# Patient Record
Sex: Male | Born: 1985 | Race: White | Hispanic: No | Marital: Married | Smoking: Never smoker
Health system: Southern US, Community
[De-identification: ages and names within clinical notes are randomized; demographics above are authoritative.]

## PROBLEM LIST (undated history)

## (undated) DIAGNOSIS — I10 Essential (primary) hypertension: Secondary | ICD-10-CM

---
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
## 2004-01-14 ENCOUNTER — Ambulatory Visit (HOSPITAL_COMMUNITY): Admission: RE | Admit: 2004-01-14 | Discharge: 2004-01-14 | Payer: Self-pay | Admitting: Family Medicine

## 2004-11-12 ENCOUNTER — Encounter: Admission: RE | Admit: 2004-11-12 | Discharge: 2004-11-12 | Payer: Self-pay | Admitting: Emergency Medicine

## 2020-03-13 ENCOUNTER — Other Ambulatory Visit: Payer: Self-pay

## 2020-03-13 ENCOUNTER — Emergency Department
Admission: EM | Admit: 2020-03-13 | Discharge: 2020-03-13 | Disposition: A | Discharge status: Home / Self Care | Payer: No Typology Code available for payment source | Attending: Emergency Medicine | Admitting: Emergency Medicine

## 2020-03-13 DIAGNOSIS — M542 Cervicalgia: Secondary | ICD-10-CM | POA: Diagnosis not present

## 2020-03-13 DIAGNOSIS — M7918 Myalgia, other site: Secondary | ICD-10-CM

## 2020-03-13 DIAGNOSIS — M546 Pain in thoracic spine: Secondary | ICD-10-CM | POA: Insufficient documentation

## 2020-03-13 DIAGNOSIS — S8992XA Unspecified injury of left lower leg, initial encounter: Secondary | ICD-10-CM | POA: Diagnosis present

## 2020-03-13 DIAGNOSIS — S8002XA Contusion of left knee, initial encounter: Secondary | ICD-10-CM | POA: Diagnosis not present

## 2020-03-13 MED ORDER — NAPROXEN 500 MG PO TABS: 500.0000 mg | ORAL_TABLET | Freq: Two times a day (BID) | ORAL | 0 refills | Status: AC

## 2020-03-13 MED ORDER — CYCLOBENZAPRINE HCL 10 MG PO TABS: 10.0000 mg | ORAL_TABLET | Freq: Three times a day (TID) | ORAL | 0 refills | Status: AC | PRN

## 2020-03-13 MED ORDER — NAPROXEN 500 MG PO TABS
500.0000 mg | ORAL_TABLET | Freq: Once | ORAL | Status: AC
  Administered 2020-03-13: 500 mg via ORAL
  Filled 2020-03-13: qty 1

## 2020-03-13 MED ORDER — CYCLOBENZAPRINE HCL 10 MG PO TABS
10.0000 mg | ORAL_TABLET | Freq: Once | ORAL | Status: DC
  Filled 2020-03-13: qty 1

## 2020-03-13 MED ORDER — HYDROCODONE-ACETAMINOPHEN 5-325 MG PO TABS: 1.0000 | ORAL_TABLET | Freq: Three times a day (TID) | ORAL | 0 refills | Status: AC | PRN

## 2020-03-13 NOTE — Discharge Instructions (Signed)
Your exam is normal at this time.  No signs of any acute fracture or dislocation based on your symptoms and presentation.  You would expect to experience several days of muscle soreness and stiffness.  Take the prescription medications as provided.  Follow-up with your primary provider for ongoing symptoms.
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
## 2020-03-15 ENCOUNTER — Emergency Department: Payer: No Typology Code available for payment source

## 2020-03-15 ENCOUNTER — Emergency Department
Admission: EM | Admit: 2020-03-15 | Discharge: 2020-03-15 | Disposition: A | Discharge status: Home / Self Care | Payer: No Typology Code available for payment source | Attending: Emergency Medicine | Admitting: Emergency Medicine

## 2020-03-15 ENCOUNTER — Encounter: Payer: Self-pay | Admitting: Emergency Medicine

## 2020-03-15 ENCOUNTER — Other Ambulatory Visit: Payer: Self-pay

## 2020-03-15 DIAGNOSIS — I10 Essential (primary) hypertension: Secondary | ICD-10-CM | POA: Insufficient documentation

## 2020-03-15 DIAGNOSIS — R197 Diarrhea, unspecified: Secondary | ICD-10-CM | POA: Insufficient documentation

## 2020-03-15 DIAGNOSIS — E860 Dehydration: Secondary | ICD-10-CM | POA: Diagnosis not present

## 2020-03-15 DIAGNOSIS — H02843 Edema of right eye, unspecified eyelid: Secondary | ICD-10-CM | POA: Insufficient documentation

## 2020-03-15 DIAGNOSIS — D696 Thrombocytopenia, unspecified: Secondary | ICD-10-CM | POA: Diagnosis not present

## 2020-03-15 DIAGNOSIS — R112 Nausea with vomiting, unspecified: Secondary | ICD-10-CM | POA: Diagnosis not present

## 2020-03-15 DIAGNOSIS — R1084 Generalized abdominal pain: Secondary | ICD-10-CM

## 2020-03-15 DIAGNOSIS — R1013 Epigastric pain: Secondary | ICD-10-CM | POA: Diagnosis present

## 2020-03-15 HISTORY — DX: Essential (primary) hypertension: I10

## 2020-03-15 LAB — URINALYSIS, COMPLETE (UACMP) WITH MICROSCOPIC
Bacteria, UA: NONE SEEN
Bilirubin Urine: NEGATIVE
Glucose, UA: NEGATIVE mg/dL
Hgb urine dipstick: NEGATIVE
Ketones, ur: NEGATIVE mg/dL
Leukocytes,Ua: NEGATIVE
Nitrite: NEGATIVE
Protein, ur: 100 mg/dL — AB
Specific Gravity, Urine: 1.032 — ABNORMAL HIGH (ref 1.005–1.030)
Squamous Epithelial / LPF: NONE SEEN (ref 0–5)
pH: 5 (ref 5.0–8.0)

## 2020-03-15 LAB — CBC WITH DIFFERENTIAL/PLATELET
Abs Immature Granulocytes: 0.03 10*3/uL (ref 0.00–0.07)
Basophils Absolute: 0 10*3/uL (ref 0.0–0.1)
Basophils Relative: 0 %
Eosinophils Absolute: 0.2 10*3/uL (ref 0.0–0.5)
Eosinophils Relative: 3 %
HCT: 44.2 % (ref 39.0–52.0)
Hemoglobin: 14.6 g/dL (ref 13.0–17.0)
Immature Granulocytes: 0 %
Lymphocytes Relative: 12 %
Lymphs Abs: 0.9 10*3/uL (ref 0.7–4.0)
MCH: 28.1 pg (ref 26.0–34.0)
MCHC: 33 g/dL (ref 30.0–36.0)
MCV: 85 fL (ref 80.0–100.0)
Monocytes Absolute: 0.6 10*3/uL (ref 0.1–1.0)
Monocytes Relative: 9 %
Neutro Abs: 5.6 10*3/uL (ref 1.7–7.7)
Neutrophils Relative %: 76 %
Platelets: 83 10*3/uL — ABNORMAL LOW (ref 150–400)
RBC: 5.2 MIL/uL (ref 4.22–5.81)
RDW: 14.4 % (ref 11.5–15.5)
Smear Review: DECREASED
WBC: 7.4 10*3/uL (ref 4.0–10.5)
nRBC: 0 % (ref 0.0–0.2)

## 2020-03-15 LAB — LIPASE, BLOOD: Lipase: 24 U/L (ref 11–51)

## 2020-03-15 LAB — COMPREHENSIVE METABOLIC PANEL
ALT: 41 U/L (ref 0–44)
AST: 26 U/L (ref 15–41)
Albumin: 4 g/dL (ref 3.5–5.0)
Alkaline Phosphatase: 50 U/L (ref 38–126)
Anion gap: 10 (ref 5–15)
BUN: 17 mg/dL (ref 6–20)
CO2: 24 mmol/L (ref 22–32)
Calcium: 8.3 mg/dL — ABNORMAL LOW (ref 8.9–10.3)
Chloride: 104 mmol/L (ref 98–111)
Creatinine, Ser: 0.95 mg/dL (ref 0.61–1.24)
GFR, Estimated: >60 mL/min (ref >60)
Glucose, Bld: 121 mg/dL — ABNORMAL HIGH (ref 70–99)
Potassium: 3.9 mmol/L (ref 3.5–5.1)
Sodium: 138 mmol/L (ref 135–145)
Total Bilirubin: 1.2 mg/dL (ref 0.3–1.2)
Total Protein: 7.2 g/dL (ref 6.5–8.1)

## 2020-03-15 LAB — LACTIC ACID, PLASMA: Lactic Acid, Venous: 1.3 mmol/L (ref 0.5–1.9)

## 2020-03-15 MED ORDER — MORPHINE SULFATE (PF) 4 MG/ML IV SOLN
4.0000 mg | Freq: Once | INTRAVENOUS | Status: DC
  Filled 2020-03-15: qty 1

## 2020-03-15 MED ORDER — ONDANSETRON HCL 4 MG/2ML IJ SOLN
4.0000 mg | Freq: Once | INTRAMUSCULAR | Status: AC
  Administered 2020-03-15: 4 mg via INTRAVENOUS
  Filled 2020-03-15: qty 2

## 2020-03-15 MED ORDER — IOHEXOL 300 MG/ML  SOLN
125.0000 mL | Freq: Once | INTRAMUSCULAR | Status: AC | PRN
  Administered 2020-03-15: 125 mL via INTRAVENOUS

## 2020-03-15 MED ORDER — ONDANSETRON 4 MG PO TBDP: 4.0000 mg | ORAL_TABLET | Freq: Three times a day (TID) | ORAL | 0 refills | Status: DC | PRN

## 2020-03-15 MED ORDER — LACTATED RINGERS IV BOLUS
1000.0000 mL | Freq: Once | INTRAVENOUS | Status: AC
  Administered 2020-03-15: 1000 mL via INTRAVENOUS
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
## 2020-03-15 NOTE — Discharge Instructions (Addendum)
As we discussed, you had signs of dehydration, but largely reassuring work-up.  I sent a prescription for Zofran nausea medicine to use as needed to the [facility].  If you have any worsening symptoms despite this medication, particularly fevers, passing out or uncontrolled symptoms, please return to the ED.
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
## 2021-09-07 IMAGING — CR DG CHEST 2V
1 series · 2 of 2 positions shown · non-contrast
Comparison: None.

CLINICAL DATA: Vomiting and diarrhea.

EXAM:
CHEST - 2 VIEW

[Series 1: dg chest 2 view · 0.14mm/px · 2 of 2 slices shown]
[im 1/2]
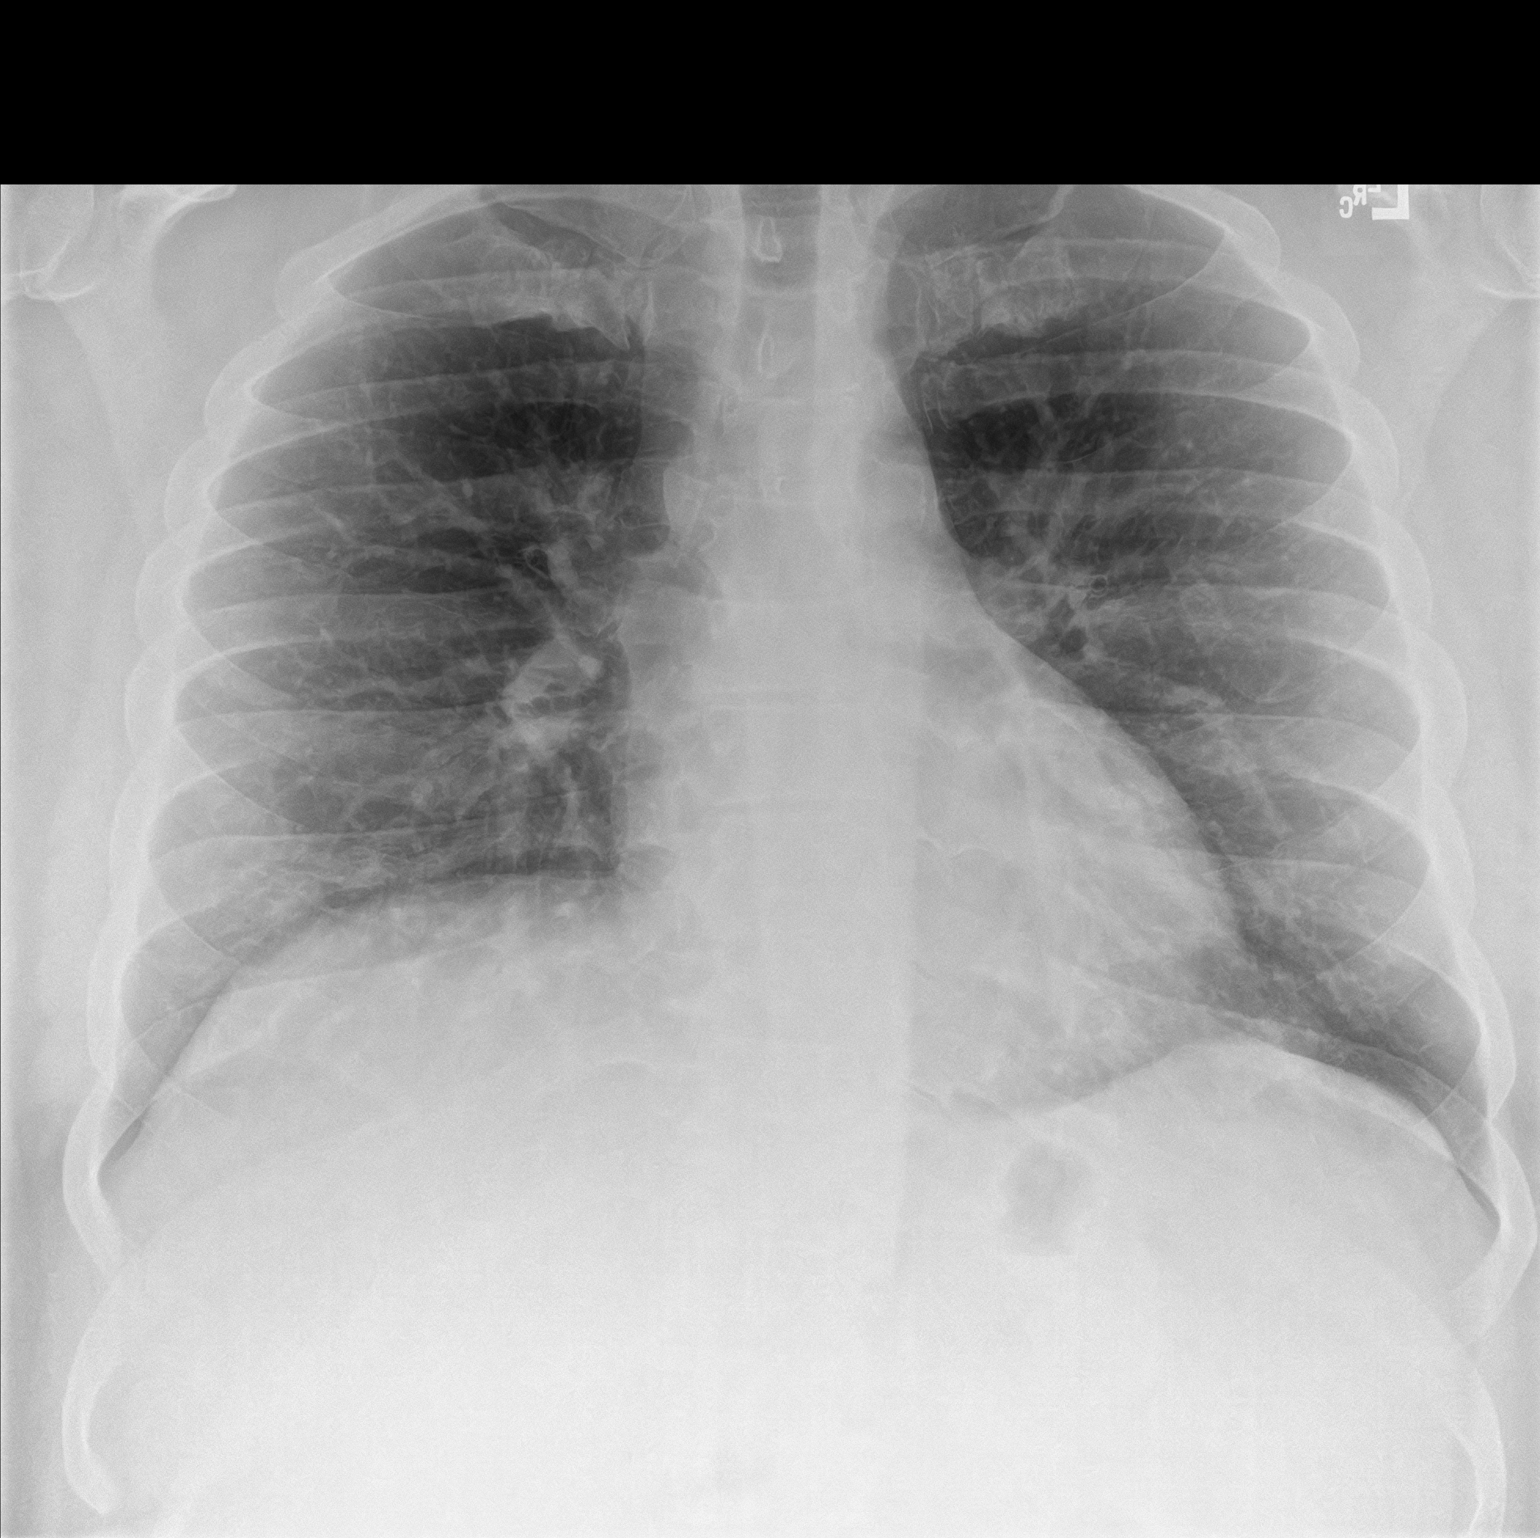
[im 2/2]
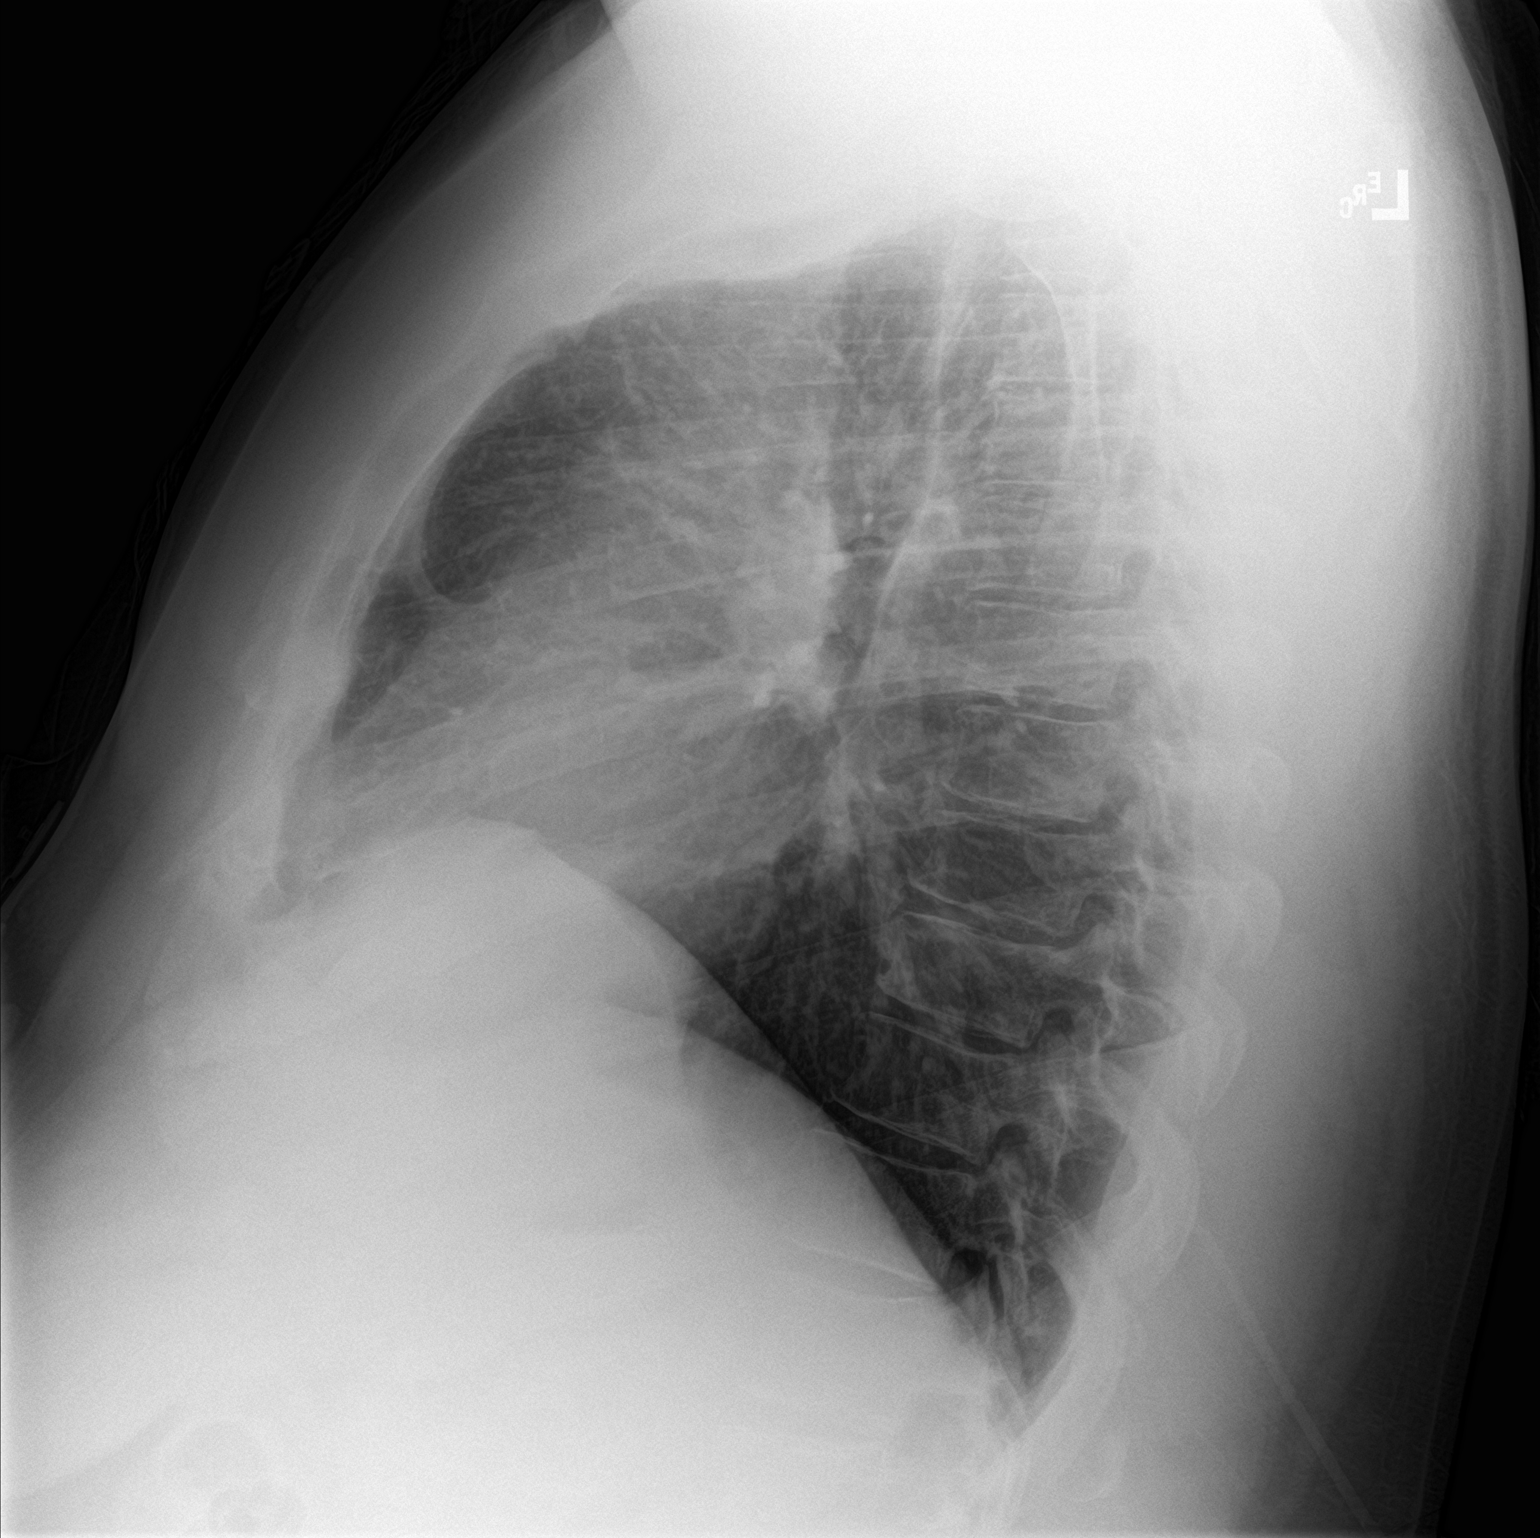

[2 of 2 positions shown; findings below may reference images not displayed]

FINDINGS: The heart size and mediastinal contours are within normal limits.
Both lungs are clear. The visualized skeletal structures are
unremarkable.
IMPRESSION: No active cardiopulmonary disease.

## 2021-11-25 ENCOUNTER — Telehealth: Payer: Self-pay | Admitting: Internal Medicine

## 2021-11-25 NOTE — Telephone Encounter (Signed)
Scheduled appt per 8/7 referral. Pt is aware of appt date and time. Pt is aware to arrive 15 mins prior to appt time and to bring and updated insurance card. Pt is aware of appt location.   

## 2021-12-15 ENCOUNTER — Other Ambulatory Visit: Payer: Self-pay

## 2021-12-15 ENCOUNTER — Encounter: Payer: Self-pay | Admitting: Nurse Practitioner

## 2021-12-15 ENCOUNTER — Inpatient Hospital Stay: Payer: 59

## 2021-12-15 ENCOUNTER — Other Ambulatory Visit: Payer: No Typology Code available for payment source

## 2021-12-15 ENCOUNTER — Inpatient Hospital Stay: Payer: 59 | Attending: Internal Medicine | Admitting: Nurse Practitioner

## 2021-12-15 ENCOUNTER — Encounter: Payer: No Typology Code available for payment source | Admitting: Internal Medicine

## 2021-12-15 VITALS — BP 160/94 | HR 87 | Temp 98.3°F | Resp 18 | Ht 75.0 in | Wt >= 6400 oz

## 2021-12-15 DIAGNOSIS — I10 Essential (primary) hypertension: Secondary | ICD-10-CM | POA: Insufficient documentation

## 2021-12-15 DIAGNOSIS — R161 Splenomegaly, not elsewhere classified: Secondary | ICD-10-CM | POA: Diagnosis not present

## 2021-12-15 DIAGNOSIS — D696 Thrombocytopenia, unspecified: Secondary | ICD-10-CM | POA: Diagnosis present

## 2021-12-15 LAB — CBC WITH DIFFERENTIAL (CANCER CENTER ONLY)
Abs Immature Granulocytes: 0 K/uL (ref 0.00–0.07)
Basophils Absolute: 0.1 K/uL (ref 0.0–0.1)
Basophils Relative: 2 %
Eosinophils Absolute: 0.7 K/uL — ABNORMAL HIGH (ref 0.0–0.5)
Eosinophils Relative: 14 %
HCT: 41.3 % (ref 39.0–52.0)
Hemoglobin: 14.3 g/dL (ref 13.0–17.0)
Immature Granulocytes: 0 %
Lymphocytes Relative: 33 %
Lymphs Abs: 1.7 K/uL (ref 0.7–4.0)
MCH: 27.9 pg (ref 26.0–34.0)
MCHC: 34.6 g/dL (ref 30.0–36.0)
MCV: 80.7 fL (ref 80.0–100.0)
Monocytes Absolute: 0.5 K/uL (ref 0.1–1.0)
Monocytes Relative: 9 %
Neutro Abs: 2.2 K/uL (ref 1.7–7.7)
Neutrophils Relative %: 42 %
Platelet Count: 77 K/uL — ABNORMAL LOW (ref 150–400)
RBC: 5.12 MIL/uL (ref 4.22–5.81)
RDW: 14 % (ref 11.5–15.5)
Smear Review: NORMAL
WBC Count: 5.2 K/uL (ref 4.0–10.5)
nRBC: 0 % (ref 0.0–0.2)

## 2021-12-15 LAB — HEPATITIS B SURFACE ANTIBODY,QUALITATIVE: Hep B S Ab: REACTIVE — AB

## 2021-12-15 LAB — HIV ANTIBODY (ROUTINE TESTING W REFLEX): HIV Screen 4th Generation wRfx: NONREACTIVE

## 2021-12-15 LAB — VITAMIN B12: Vitamin B-12: 255 pg/mL (ref 180–914)

## 2021-12-15 LAB — HEPATITIS C ANTIBODY: HCV Ab: NONREACTIVE

## 2021-12-15 LAB — IMMATURE PLATELET FRACTION: Immature Platelet Fraction: 3.9 % (ref 1.2–8.6)

## 2021-12-15 LAB — SAVE SMEAR(SSMR), FOR PROVIDER SLIDE REVIEW
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
## 2021-12-15 NOTE — Progress Notes (Signed)
This nurse faxed signed release and request for Abdominal Ultrasound results from [facility] and CBC results from past 10 years from Dr. Azucena Cecil at [facility].  No further concerns at this time.

## 2021-12-16 LAB — FOLATE RBC
Folate, Hemolysate: 400 ng/mL
Folate, RBC: 920 ng/mL (ref >498)
Hematocrit: 43.5 % (ref 37.5–51.0)

## 2021-12-17 ENCOUNTER — Encounter: Payer: Self-pay | Admitting: Nurse Practitioner

## 2021-12-19 LAB — METHYLMALONIC ACID, SERUM: Methylmalonic Acid, Quantitative: 189 nmol/L (ref 0–378)

## 2021-12-21 ENCOUNTER — Telehealth: Payer: Self-pay | Admitting: Nurse Practitioner

## 2021-12-21 NOTE — Telephone Encounter (Signed)
Scheduled follow-up appointment per 8/30 los. Patient is aware. 

## 2022-01-19 ENCOUNTER — Encounter: Payer: Self-pay | Admitting: Internal Medicine

## 2022-12-14 ENCOUNTER — Other Ambulatory Visit: Payer: Self-pay | Admitting: Nurse Practitioner

## 2022-12-14 ENCOUNTER — Other Ambulatory Visit: Payer: Self-pay

## 2022-12-14 DIAGNOSIS — D696 Thrombocytopenia, unspecified: Secondary | ICD-10-CM
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
## 2022-12-15 ENCOUNTER — Inpatient Hospital Stay: Payer: No Typology Code available for payment source | Attending: Nurse Practitioner | Admitting: Nurse Practitioner

## 2022-12-15 ENCOUNTER — Inpatient Hospital Stay: Payer: Self-pay

## 2023-08-01 ENCOUNTER — Encounter: Payer: Self-pay | Admitting: Emergency Medicine

## 2023-08-01 ENCOUNTER — Other Ambulatory Visit: Payer: Self-pay

## 2023-08-01 ENCOUNTER — Emergency Department
Admission: EM | Admit: 2023-08-01 | Discharge: 2023-08-01 | Disposition: A | Discharge status: Home / Self Care | Payer: Self-pay | Attending: Emergency Medicine | Admitting: Emergency Medicine

## 2023-08-01 DIAGNOSIS — Z760 Encounter for issue of repeat prescription: Secondary | ICD-10-CM | POA: Insufficient documentation

## 2023-08-01 DIAGNOSIS — S91311A Laceration without foreign body, right foot, initial encounter: Secondary | ICD-10-CM | POA: Insufficient documentation

## 2023-08-01 DIAGNOSIS — I10 Essential (primary) hypertension: Secondary | ICD-10-CM | POA: Insufficient documentation

## 2023-08-01 DIAGNOSIS — Z23 Encounter for immunization: Secondary | ICD-10-CM | POA: Insufficient documentation

## 2023-08-01 DIAGNOSIS — X18XXXA Contact with other hot metals, initial encounter: Secondary | ICD-10-CM | POA: Insufficient documentation

## 2023-08-01 MED ORDER — HYDROCHLOROTHIAZIDE 12.5 MG PO TABS: 12.5000 mg | ORAL_TABLET | Freq: Once | ORAL | Status: DC

## 2023-08-01 MED ORDER — TETANUS-DIPHTH-ACELL PERTUSSIS 5-2.5-18.5 LF-MCG/0.5 IM SUSY
PREFILLED_SYRINGE | INTRAMUSCULAR | Status: AC
  Filled 2023-08-01: qty 0.5

## 2023-08-01 MED ORDER — METOPROLOL TARTRATE 25 MG PO TABS
25.0000 mg | ORAL_TABLET | Freq: Once | ORAL | Status: AC
  Administered 2023-08-01: 25 mg via ORAL
  Filled 2023-08-01: qty 1

## 2023-08-01 MED ORDER — TETANUS-DIPHTH-ACELL PERTUSSIS 5-2.5-18.5 LF-MCG/0.5 IM SUSY
0.5000 mL | PREFILLED_SYRINGE | Freq: Once | INTRAMUSCULAR | Status: AC
  Administered 2023-08-01: 0.5 mL via INTRAMUSCULAR

## 2023-08-01 MED ORDER — HYDROCHLOROTHIAZIDE 12.5 MG PO TABS: 25.0000 mg | ORAL_TABLET | Freq: Every day | ORAL | 3 refills | Status: AC

## 2023-08-01 MED ORDER — METOPROLOL TARTRATE 25 MG PO TABS: 25.0000 mg | ORAL_TABLET | Freq: Two times a day (BID) | ORAL | 11 refills | Status: AC

## 2023-08-01 MED ORDER — BENAZEPRIL HCL 10 MG PO TABS: 10.0000 mg | ORAL_TABLET | Freq: Every day | ORAL | Status: DC

## 2023-08-01 MED ORDER — HYDROCHLOROTHIAZIDE 25 MG PO TABS
25.0000 mg | ORAL_TABLET | Freq: Every day | ORAL | Status: DC
  Administered 2023-08-01: 25 mg via ORAL
  Filled 2023-08-01: qty 1

## 2023-08-01 MED ORDER — LIDOCAINE-EPINEPHRINE (PF) 2 %-1:200000 IJ SOLN
20.0000 mL | Freq: Once | INTRAMUSCULAR | Status: AC
  Administered 2023-08-01: 20 mL
  Filled 2023-08-01: qty 20

## 2023-08-01 MED ORDER — LOSARTAN POTASSIUM 50 MG PO TABS
100.0000 mg | ORAL_TABLET | Freq: Once | ORAL | Status: AC
  Administered 2023-08-01: 100 mg via ORAL
  Filled 2023-08-01: qty 2

## 2023-08-01 MED ORDER — LOSARTAN POTASSIUM 100 MG PO TABS: 100.0000 mg | ORAL_TABLET | Freq: Every day | ORAL | 11 refills | Status: AC

## 2023-08-01 NOTE — ED Triage Notes (Addendum)
 Patient ambulatory to triage with steady gait, without difficulty or distress noted; pt reports cutting rt heel PTA on sheet metal; dressing D&I; note pt has been out of his BP meds x 2 wks due to financial reasons--denies any c/o or symptoms and st hx of same

## 2023-08-01 NOTE — ED Notes (Signed)
 Pt reports chronic "very high blood pressure". Has not taken medication x1 week due to financial inability.

## 2023-08-01 NOTE — ED Provider Notes (Signed)
 [facility] Provider Note    Event Date/Time   First MD Initiated Contact with Patient 08/01/23 1924     (approximate)   History   Laceration   HPI  Eric Richards is a 38 y.o. male with PMH of hypertension who presents for evaluation of laceration to the right heel.  Patient stepped over a piece of sheet metal and caught his heel on the edge causing the laceration.  Tetanus shot is not up-to-date.  Patient also reports he has been out of his blood pressure medication for about 2 weeks.  Denies any symptoms at this time.      Physical Exam   Triage Vital Signs: ED Triage Vitals  Encounter Vitals Group     BP 08/01/23 1916 (!) 264/204     Systolic BP Percentile --      Diastolic BP Percentile --      Pulse Rate 08/01/23 1916 (!) 117     Resp 08/01/23 1916 20     Temp 08/01/23 1916 97.7 F (36.5 C)     Temp Source 08/01/23 1916 Oral     SpO2 08/01/23 1916 100 %     Weight 08/01/23 1915 (!) 400 lb (181.4 kg)     Height 08/01/23 1915 6\' 3"  (1.905 m)     Head Circumference --      Peak Flow --      Pain Score 08/01/23 1916 0     Pain Loc --      Pain Education --      Exclude from Growth Chart --     Most recent vital signs: Vitals:   08/01/23 2251 08/01/23 2252  BP: (!) 226/144 (!) 226/156  Pulse:    Resp:    Temp:    SpO2:     General: Awake, no distress.  CV:  Good peripheral perfusion.  RRR. Resp:  Normal effort.  CTAB. Abd:  No distention.  Other:  Approximately 3 cm laceration to the right heel actively bleeding.  Dorsalis pedis pulse is intact, range of motion of the foot and ankle maintained.  Sensation intact.   ED Results / Procedures / Treatments   Labs (all labs ordered are listed, but only abnormal results are displayed) Labs Reviewed - No data to display   PROCEDURES:  Critical Care performed: No  .Laceration Repair  Date/Time: 08/01/2023 10:21 PM  Performed by: Cameron Ali, PA-C Authorized by:  Cameron Ali, PA-C   Consent:    Consent obtained:  Verbal   Consent given by:  Patient   Risks, benefits, and alternatives were discussed: yes     Risks discussed:  Infection, pain, poor cosmetic result and poor wound healing   Alternatives discussed:  No treatment Universal protocol:    Patient identity confirmed:  Verbally with patient Anesthesia:    Anesthesia method:  Local infiltration   Local anesthetic:  Lidocaine 2% WITH epi Laceration details:    Location:  Foot   Foot location:  R heel   Length (cm):  3   Depth (mm):  5 Pre-procedure details:    Preparation:  Patient was prepped and draped in usual sterile fashion Exploration:    Hemostasis achieved with:  Epinephrine and direct pressure Treatment:    Area cleansed with:  Povidone-iodine   Amount of cleaning:  Standard   Irrigation solution:  Sterile saline   Irrigation method:  Syringe Skin repair:    Repair method:  Sutures   Suture  size:  4-0   Suture material:  Prolene   Suture technique:  Running   Number of sutures:  1 Approximation:    Approximation:  Close Repair type:    Repair type:  Simple Post-procedure details:    Dressing:  Adhesive bandage   Procedure completion:  Tolerated well, no immediate complications    MEDICATIONS ORDERED IN ED: Medications  hydrochlorothiazide (HYDRODIURIL) tablet 25 mg (25 mg Oral Given 08/01/23 2202)  lidocaine-EPINEPHrine (XYLOCAINE W/EPI) 2 %-1:200000 (PF) injection 20 mL (20 mLs Infiltration Given by Other 08/01/23 2041)  losartan (COZAAR) tablet 100 mg (100 mg Oral Given 08/01/23 2201)  metoprolol tartrate (LOPRESSOR) tablet 25 mg (25 mg Oral Given 08/01/23 2202)  Tdap (BOOSTRIX) injection 0.5 mL (0.5 mLs Intramuscular Given 08/01/23 2208)     IMPRESSION / MDM / ASSESSMENT AND PLAN / ED COURSE  I reviewed the triage vital signs and the nursing notes.                             38 year old male presents for evaluation of laceration to the right heel.   Blood pressure is significantly elevated, however patient does have history of hypertension and has not had his medication for about a week and a half.  Vital signs are stable otherwise patient NAD on exam.  Differential diagnosis includes, but is not limited to, laceration, hypertensive urgency, hypertensive emergency, uncontrolled hypertension.  Patient's presentation is most consistent with acute, uncomplicated illness.  Laceration repaired as described in the procedure note above.  Patient was instructed on wound care.  Tetanus shot was updated today.   Patient denies any symptoms consistent with hypertensive emergency like chest pain, shortness of breath, headaches or blurry vision.  Patient was reassessed multiple times and continued to deny symptoms.  Patient was given his home medications for blood pressure while in the emergency department.  Patient did have some improvement in his blood pressure after taking his medications.  I also sent refills of them.  Patient voiced understanding, all questions were answered and he was stable at discharge.     FINAL CLINICAL IMPRESSION(S) / ED DIAGNOSES   Final diagnoses:  Laceration of right heel, initial encounter  Uncontrolled hypertension     Rx / DC Orders   ED Discharge Orders          Ordered    hydrochlorothiazide (HYDRODIURIL) 12.5 MG tablet  Daily        08/01/23 2226    metoprolol tartrate (LOPRESSOR) 25 MG tablet  2 times daily        08/01/23 2226    losartan (COZAAR) 100 MG tablet  Daily        08/01/23 2226             Note:  This document was prepared using Dragon voice recognition software and may include unintentional dictation errors.   Phyliss Breen, PA-C 08/01/23 2255    Shane Darling, MD 08/02/23 1242

## 2023-08-01 NOTE — Discharge Instructions (Signed)
 The stitches will need to be removed in 12 to 14 days.  This can be done by primary care, urgent care or the emergency department.  Watch for signs of infection including redness, warmth, swelling, pain and pus drainage.  If you develop any of these please return to the ED, urgent care or your primary care provider.  Please wash the wound with soap and water daily, then cover with a bandage.  You can take Tylenol and ibuprofen as needed for pain.  I have sent refills of your blood pressure medications to your pharmacy.  Return to the emergency department if you have any chest pain, difficulty breathing, blurry vision or headache that will not go away with medication.

## 2023-08-25 ENCOUNTER — Ambulatory Visit
Admission: EM | Admit: 2023-08-25 | Discharge: 2023-08-25 | Disposition: A | Discharge status: Home / Self Care | Payer: Self-pay | Attending: Emergency Medicine | Admitting: Emergency Medicine

## 2023-08-25 DIAGNOSIS — S91311A Laceration without foreign body, right foot, initial encounter: Secondary | ICD-10-CM

## 2023-08-25 MED ORDER — DOXYCYCLINE HYCLATE 100 MG PO CAPS: 100.0000 mg | ORAL_CAPSULE | Freq: Two times a day (BID) | ORAL | 0 refills | Status: AC

## 2023-08-25 NOTE — Discharge Instructions (Signed)
 Your laceration, as you have had continuous drainage we will patient on an antibiotic for coverage for infection  Suture has been removed by nursing staff  Take doxycycline twice daily for 7 days  Cleanse wound with unscented soap and water daily, may leave open to air as wound has close or you may cover with a Band-Aid if concern for contamination or irritation from clothing  If any further concerns regarding healing please follow-up for reevaluation

## 2023-08-25 NOTE — ED Triage Notes (Signed)
 Patient presents to UC for suture removal. He had 1 suture placed to the right heel on 4/15 at the ED. He was instructed to have removed in 2 weeks. Pt states he does not have insurance so the reason for the delay. Reports he has noted some drainage, unsure of the color.

## 2023-08-25 NOTE — ED Notes (Signed)
 Provided examined heel of foot before suture removal.

## 2023-08-25 NOTE — ED Provider Notes (Signed)
 Eric Richards    CSN: [id number] Arrival date & time: 08/25/23  4403      History   Chief Complaint Chief Complaint  Patient presents with   Suture / Staple Removal    HPI Eric Richards is a 38 y.o. male.   Presents for evaluation of suture removal to a laceration to the right heel that was placed on 08/01/2023.  Recommended removal time 2014 days however patient unable to complete due to financial burden.  Endorses persistent drainage noted on Band-Aid upon removal, unable to identify if this is puslike in presentation.  Denies redness, pain or swelling.  Cleanses during normal hygiene.  Past Medical History:  Diagnosis Date   Hypertension     There are no active problems to display for this patient.   History reviewed. No pertinent surgical history.     Home Medications    Prior to Admission medications   Medication Sig Start Date End Date Taking? Authorizing Provider  doxycycline (VIBRAMYCIN) 100 MG capsule Take 1 capsule (100 mg total) by mouth 2 (two) times daily. 08/25/23  Yes Jazlin Tapscott, Maybelle Spatz, NP  cetirizine (ZYRTEC) 10 MG tablet Take 10 mg by mouth as needed for allergies.    [provider]  hydrochlorothiazide  (HYDRODIURIL ) 12.5 MG tablet Take 2 tablets (25 mg total) by mouth daily. 08/01/23   Phyliss Breen, PA-C  losartan  (COZAAR ) 100 MG tablet Take 1 tablet (100 mg total) by mouth daily. 08/01/23   Phyliss Breen, PA-C  metoprolol  tartrate (LOPRESSOR ) 25 MG tablet Take 1 tablet (25 mg total) by mouth 2 (two) times daily. 08/01/23 07/31/24  Monda Angry A, PA-C  montelukast (SINGULAIR) 10 MG tablet Take 10 mg by mouth as needed.    [provider]  omeprazole (PRILOSEC) 20 MG capsule Take 20 mg by mouth daily.    [provider]    Family History Family History  Problem Relation Age of Onset   Thrombocytopenia Mother        ITP   Cancer Paternal Aunt        pancreatic cancer   Cancer Paternal Grandfather         bone/spine    Social History Social History   Tobacco Use   Smoking status: Never   Smokeless tobacco: Never  Vaping Use   Vaping status: Never Used  Substance Use Topics   Alcohol use: Not Currently   Drug use: Never     Allergies   Penicillins   Review of Systems Review of Systems   Physical Exam Triage Vital Signs ED Triage Vitals [08/25/23 1046]  Encounter Vitals Group     BP      Systolic BP Percentile      Diastolic BP Percentile      Pulse      Resp (P) 16     Temp      Temp Source (P) Temporal     SpO2      Weight      Height      Head Circumference      Peak Flow      Pain Score      Pain Loc      Pain Education      Exclude from Growth Chart    No data found.  Updated Vital Signs Resp (P) 16   Visual Acuity Right Eye Distance:   Left Eye Distance:   Bilateral Distance:    Right Eye Near:   Left  Eye Near:    Bilateral Near:     Physical Exam Constitutional:      Appearance: Normal appearance.  Eyes:     Extraocular Movements: Extraocular movements intact.  Pulmonary:     Effort: Pulmonary effort is normal.  Skin:    Comments: Laceration present to the right heel with suture, tender to palpation, no erythema or swelling, no drainage noted  Neurological:     Mental Status: He is alert and oriented to person, place, and time. Mental status is at baseline.      UC Treatments / Results  Labs (all labs ordered are listed, but only abnormal results are displayed) Labs Reviewed - No data to display  EKG   Radiology No results found.  Procedures Procedures (including critical care time)  Medications Ordered in UC Medications - No data to display  Initial Impression / Assessment and Plan / UC Course  I have reviewed the triage vital signs and the nursing notes.  Pertinent labs & imaging results that were available during my care of the patient were reviewed by me and considered in my medica l decision making (see  chart for details).  Laceration to  right heel  Suture removed by nursing staff, having to interrupt in bed as there is scarring over the laceration, no drainage on exam or if patient endorses frequent drainage at home prescribe doxycycline, advise daily cleansing with unscented soap and water and covering as needed, advise follow-up with urgent care for any delays in healing Final Clinical Impressions(s) / UC Diagnoses   Final diagnoses:  Laceration of right heel, initial encounter   Discharge Instructions      Your laceration, as you have had continuous drainage we will patient on an antibiotic for coverage for infection  Suture has been removed by nursing staff  Take doxycycline twice daily for 7 days  Cleanse wound with unscented soap and water daily, may leave open to air as wound has close or you may cover with a Band-Aid if concern for contamination or irritation from clothing  If any further concerns regarding healing please follow-up for reevaluation  ED Prescriptions     Medication Sig Dispense Auth. Provider   doxycycline (VIBRAMYCIN) 100 MG capsule Take 1 capsule (100 mg total) by mouth 2 (two) times daily. 14 capsule Toney Difatta, Maybelle Spatz, NP      PDMP not reviewed this encounter.   Reena Canning, NP 08/25/23 1104

## 2023-12-31 ENCOUNTER — Ambulatory Visit
Admission: RE | Admit: 2023-12-31 | Discharge: 2023-12-31 | Disposition: A | Discharge status: Home / Self Care | Payer: Self-pay | Source: Ambulatory Visit | Attending: Emergency Medicine | Admitting: Emergency Medicine

## 2023-12-31 VITALS — BP 140/80 | HR 82 | Temp 98.2°F | Resp 18

## 2023-12-31 DIAGNOSIS — J069 Acute upper respiratory infection, unspecified: Secondary | ICD-10-CM

## 2023-12-31 MED ORDER — AZITHROMYCIN 250 MG PO TABS: 250.0000 mg | ORAL_TABLET | Freq: Every day | ORAL | 0 refills | Status: AC

## 2023-12-31 NOTE — ED Provider Notes (Signed)
 Eric Richards    CSN: [id number] Arrival date & time: 12/31/23  9146      History   Chief Complaint Chief Complaint  Patient presents with   Cough    I have a cough and congestion that I think has turned into a sinus infection. - Entered by patient   Nasal Congestion   Headache    HPI Eric Richards is a 38 y.o. male.   Patient presents for evaluation of intermittent headaches, sinus pressure to the bridge of the nose, nasal and chest congestion, productive cough, bloating, sore throat present for 7 days.  No known sick contact prior.  Attempted use of Coricidin and antihistamines.  Tolerable to food and liquids.  Denies fever, shortness of breath or wheezing.  Past Medical History:  Diagnosis Date   Hypertension     There are no active problems to display for this patient.   History reviewed. No pertinent surgical history.     Home Medications    Prior to Admission medications   Medication Sig Start Date End Date Taking? Authorizing Provider  azithromycin  (ZITHROMAX ) 250 MG tablet Take 1 tablet (250 mg total) by mouth daily. Take first 2 tablets together, then 1 every day until finished. 12/31/23  Yes Amarrion Pastorino, Shelba JONELLE, NP  cetirizine (ZYRTEC) 10 MG tablet Take 10 mg by mouth as needed for allergies.    [provider]  doxycycline  (VIBRAMYCIN ) 100 MG capsule Take 1 capsule (100 mg total) by mouth 2 (two) times daily. 08/25/23   Teresa Shelba JONELLE, NP  hydrochlorothiazide  (HYDRODIURIL ) 12.5 MG tablet Take 2 tablets (25 mg total) by mouth daily. 08/01/23   Cleaster Tinnie LABOR, PA-C  losartan  (COZAAR ) 100 MG tablet Take 1 tablet (100 mg total) by mouth daily. 08/01/23   Cleaster Tinnie LABOR, PA-C  metoprolol  tartrate (LOPRESSOR ) 25 MG tablet Take 1 tablet (25 mg total) by mouth 2 (two) times daily. 08/01/23 07/31/24  Cleaster Tinnie A, PA-C  montelukast (SINGULAIR) 10 MG tablet Take 10 mg by mouth as needed.    [provider]  omeprazole (PRILOSEC)  20 MG capsule Take 20 mg by mouth daily.    [provider]    Family History Family History  Problem Relation Age of Onset   Thrombocytopenia Mother        ITP   Cancer Paternal Aunt        pancreatic cancer   Cancer Paternal Grandfather        bone/spine    Social History Social History   Tobacco Use   Smoking status: Never   Smokeless tobacco: Never  Vaping Use   Vaping status: Never Used  Substance Use Topics   Alcohol use: Not Currently   Drug use: Never     Allergies   Penicillins   Review of Systems Review of Systems   Physical Exam Triage Vital Signs ED Triage Vitals  Encounter Vitals Group     BP 12/31/23 0914 (!) 140/80     Girls Systolic BP Percentile --      Girls Diastolic BP Percentile --      Boys Systolic BP Percentile --      Boys Diastolic BP Percentile --      Pulse Rate 12/31/23 0914 82     Resp 12/31/23 0914 18     Temp 12/31/23 0914 98.2 F (36.8 C)     Temp Source 12/31/23 0914 Oral     SpO2 12/31/23 0914 99 %  Weight --      Height --      Head Circumference --      Peak Flow --      Pain Score 12/31/23 0910 2     Pain Loc --      Pain Education --      Exclude from Growth Chart --    No data found.  Updated Vital Signs BP (!) 140/80 (BP Location: Left Arm)   Pulse 82   Temp 98.2 F (36.8 C) (Oral)   Resp 18   SpO2 99%   Visual Acuity Right Eye Distance:   Left Eye Distance:   Bilateral Distance:    Right Eye Near:   Left Eye Near:    Bilateral Near:     Physical Exam Constitutional:      Appearance: Normal appearance.  HENT:     Head: Normocephalic.     Right Ear: Tympanic membrane, ear canal and external ear normal.     Left Ear: Tympanic membrane, ear canal and external ear normal.     Nose: Congestion present.     Mouth/Throat:     Pharynx: No oropharyngeal exudate or posterior oropharyngeal erythema.  Eyes:     Extraocular Movements: Extraocular movements intact.  Cardiovascular:      Rate and Rhythm: Normal rate and regular rhythm.     Pulses: Normal pulses.     Heart sounds: Normal heart sounds.  Pulmonary:     Effort: Pulmonary effort is normal.     Breath sounds: Normal breath sounds.  Musculoskeletal:     Cervical back: Normal range of motion and neck supple.  Neurological:     Mental Status: He is alert and oriented to person, place, and time. Mental status is at baseline.      UC Treatments / Results  Labs (all labs ordered are listed, but only abnormal results are displayed) Labs Reviewed - No data to display  EKG   Radiology No results found.  Procedures Procedures (including critical care time)  Medications Ordered in UC Medications - No data to display  Initial Impression / Assessment and Plan / UC Course  I have reviewed the triage vital signs and the nursing notes.  Pertinent labs & imaging results that were available during my care of the patient were reviewed by me and considered in my medical decision making (see chart for details).   Acute URI  Patient is in no signs of distress nor toxic appearing.  Vital signs are stable.  Low suspicion for pneumonia, pneumothorax or bronchitis and therefore will defer imaging.  Viral testing deferred due to timeline of illness.  Symptoms present for 7 days will empirically place on azithromycin .  Declined prescription cough medicine.May use additional over-the-counter medications as needed for supportive care.  May follow-up with urgent care as needed if symptoms persist or worsen.   Final Clinical Impressions(s) / UC Diagnoses   Final diagnoses:  Acute URI     Discharge Instructions      Take azithromycin  to get rid of bacteria causing symptoms to the linger  You can take Tylenol  and/or Ibuprofen as needed for fever reduction and pain relief.   For cough: honey 1/2 to 1 teaspoon (you can dilute the honey in water or another fluid).  You can also use guaifenesin and dextromethorphan for  cough. You can use a humidifier for chest congestion and cough.  If you don't have a humidifier, you can sit in the bathroom with the hot  shower running.      For sore throat: try warm salt water gargles, cepacol lozenges, throat spray, warm tea or water with lemon/honey, popsicles or ice, or OTC cold relief medicine for throat discomfort.   For congestion: take a daily anti-histamine like Zyrtec, Claritin, and a oral decongestant, such as pseudoephedrine.  You can also use Flonase 1-2 sprays in each nostril daily.   It is important to stay hydrated: drink plenty of fluids (water, gatorade/powerade/pedialyte, juices, or teas) to keep your throat moisturized and help further relieve irritation/discomfort.    ED Prescriptions     Medication Sig Dispense Auth. Provider   azithromycin  (ZITHROMAX ) 250 MG tablet Take 1 tablet (250 mg total) by mouth daily. Take first 2 tablets together, then 1 every day until finished. 6 tablet Cadi Rhinehart R, NP      PDMP not reviewed this encounter.   Teresa Shelba SAUNDERS, [place] 12/31/23 [phone]

## 2023-12-31 NOTE — Discharge Instructions (Addendum)
 Take azithromycin  to get rid of bacteria causing symptoms to the linger  You can take Tylenol  and/or Ibuprofen as needed for fever reduction and pain relief.   For cough: honey 1/2 to 1 teaspoon (you can dilute the honey in water or another fluid).  You can also use guaifenesin and dextromethorphan for cough. You can use a humidifier for chest congestion and cough.  If you don't have a humidifier, you can sit in the bathroom with the hot shower running.      For sore throat: try warm salt water gargles, cepacol lozenges, throat spray, warm tea or water with lemon/honey, popsicles or ice, or OTC cold relief medicine for throat discomfort.   For congestion: take a daily anti-histamine like Zyrtec, Claritin, and a oral decongestant, such as pseudoephedrine.  You can also use Flonase 1-2 sprays in each nostril daily.   It is important to stay hydrated: drink plenty of fluids (water, gatorade/powerade/pedialyte, juices, or teas) to keep your throat moisturized and help further relieve irritation/discomfort.

## 2023-12-31 NOTE — ED Triage Notes (Signed)
 Patient reports nasal congestion, cough with dark yellow sputum and headache x 1 week ago. Patient reports taking Zyrtec and mucinex with no relief. Rates pain 2/10.

## 2024-05-16 ENCOUNTER — Emergency Department: Payer: Self-pay

## 2024-05-16 ENCOUNTER — Ambulatory Visit
Admission: RE | Admit: 2024-05-16 | Discharge: 2024-05-16 | Disposition: A | Discharge status: Home / Self Care | Payer: Self-pay | Attending: Family Medicine | Admitting: Family Medicine

## 2024-05-16 ENCOUNTER — Other Ambulatory Visit: Payer: Self-pay

## 2024-05-16 ENCOUNTER — Emergency Department
Admission: EM | Admit: 2024-05-16 | Discharge: 2024-05-16 | Disposition: A | Discharge status: Home / Self Care | Payer: Self-pay

## 2024-05-16 VITALS — BP 226/160 | HR 108 | Temp 97.9°F | Resp 18

## 2024-05-16 DIAGNOSIS — I160 Hypertensive urgency: Secondary | ICD-10-CM | POA: Insufficient documentation

## 2024-05-16 DIAGNOSIS — N289 Disorder of kidney and ureter, unspecified: Secondary | ICD-10-CM | POA: Insufficient documentation

## 2024-05-16 DIAGNOSIS — I10 Essential (primary) hypertension: Secondary | ICD-10-CM | POA: Insufficient documentation

## 2024-05-16 DIAGNOSIS — M545 Low back pain, unspecified: Secondary | ICD-10-CM

## 2024-05-16 DIAGNOSIS — R10A1 Flank pain, right side: Secondary | ICD-10-CM

## 2024-05-16 DIAGNOSIS — Z79899 Other long term (current) drug therapy: Secondary | ICD-10-CM | POA: Insufficient documentation

## 2024-05-16 LAB — COMPREHENSIVE METABOLIC PANEL WITH GFR
ALT: 21 U/L (ref 0–44)
AST: 26 U/L (ref 15–41)
Albumin: 4.5 g/dL (ref 3.5–5.0)
Alkaline Phosphatase: 70 U/L (ref 38–126)
Anion gap: 13 (ref 5–15)
BUN: 15 mg/dL (ref 6–20)
CO2: 23 mmol/L (ref 22–32)
Calcium: 9.2 mg/dL (ref 8.9–10.3)
Chloride: 103 mmol/L (ref 98–111)
Creatinine, Ser: 1.28 mg/dL — ABNORMAL HIGH (ref 0.61–1.24)
GFR, Estimated: >60 mL/min
Glucose, Bld: 96 mg/dL (ref 70–99)
Potassium: 3.6 mmol/L (ref 3.5–5.1)
Sodium: 140 mmol/L (ref 135–145)
Total Bilirubin: 0.8 mg/dL (ref 0.0–1.2)
Total Protein: 7 g/dL (ref 6.5–8.1)

## 2024-05-16 LAB — CBC WITH DIFFERENTIAL/PLATELET
Abs Immature Granulocytes: 0.01 10*3/uL (ref 0.00–0.07)
Basophils Absolute: 0.1 10*3/uL (ref 0.0–0.1)
Basophils Relative: 2 %
Eosinophils Absolute: 0.4 10*3/uL (ref 0.0–0.5)
Eosinophils Relative: 6 %
HCT: 41.8 % (ref 39.0–52.0)
Hemoglobin: 14.4 g/dL (ref 13.0–17.0)
Immature Granulocytes: 0 %
Lymphocytes Relative: 18 %
Lymphs Abs: 1.2 10*3/uL (ref 0.7–4.0)
MCH: 27.2 pg (ref 26.0–34.0)
MCHC: 34.4 g/dL (ref 30.0–36.0)
MCV: 78.9 fL — ABNORMAL LOW (ref 80.0–100.0)
Monocytes Absolute: 0.6 10*3/uL (ref 0.1–1.0)
Monocytes Relative: 9 %
Neutro Abs: 4.4 10*3/uL (ref 1.7–7.7)
Neutrophils Relative %: 65 %
Platelets: 87 10*3/uL — ABNORMAL LOW (ref 150–400)
RBC: 5.3 MIL/uL (ref 4.22–5.81)
RDW: 14.6 % (ref 11.5–15.5)
WBC: 6.7 10*3/uL (ref 4.0–10.5)
nRBC: 0 % (ref 0.0–0.2)

## 2024-05-16 LAB — TROPONIN T, HIGH SENSITIVITY
Troponin T High Sensitivity: 25 ng/L — ABNORMAL HIGH (ref 0–19)
Troponin T High Sensitivity: 26 ng/L — ABNORMAL HIGH (ref 0–19)

## 2024-05-16 LAB — TSH: TSH: 1.12 u[IU]/mL (ref 0.350–4.500)

## 2024-05-16 LAB — PRO BRAIN NATRIURETIC PEPTIDE: Pro Brain Natriuretic Peptide: 616 pg/mL — ABNORMAL HIGH

## 2024-05-16 MED ORDER — NITROGLYCERIN 0.4 MG SL SUBL: 0.4000 mg | SUBLINGUAL_TABLET | Freq: Once | SUBLINGUAL | Status: DC

## 2024-05-16 MED ORDER — MORPHINE SULFATE (PF) 4 MG/ML IV SOLN
4.0000 mg | Freq: Once | INTRAVENOUS | Status: AC
  Administered 2024-05-16: 4 mg via INTRAVENOUS
  Filled 2024-05-16: qty 1

## 2024-05-16 MED ORDER — IOHEXOL 300 MG/ML  SOLN
100.0000 mL | Freq: Once | INTRAMUSCULAR | Status: AC | PRN
  Administered 2024-05-16: 100 mL via INTRAVENOUS

## 2024-05-16 MED ORDER — HYDRALAZINE HCL 20 MG/ML IJ SOLN
10.0000 mg | Freq: Once | INTRAMUSCULAR | Status: AC
  Administered 2024-05-16: 10 mg via INTRAVENOUS
  Filled 2024-05-16: qty 1

## 2024-05-16 MED ORDER — LABETALOL HCL 5 MG/ML IV SOLN
10.0000 mg | Freq: Once | INTRAVENOUS | Status: AC
  Administered 2024-05-16: 10 mg via INTRAVENOUS
  Filled 2024-05-16: qty 4

## 2024-05-16 MED ORDER — ONDANSETRON HCL 4 MG/2ML IJ SOLN
4.0000 mg | Freq: Once | INTRAMUSCULAR | Status: AC
  Administered 2024-05-16: 4 mg via INTRAVENOUS
  Filled 2024-05-16: qty 2

## 2024-05-16 MED ORDER — HYDRALAZINE HCL 10 MG PO TABS: 10.0000 mg | ORAL_TABLET | Freq: Once | ORAL | Status: DC

## 2024-05-16 MED ORDER — METHOCARBAMOL 500 MG PO TABS
1000.0000 mg | ORAL_TABLET | Freq: Once | ORAL | Status: AC
  Administered 2024-05-16: 1000 mg via ORAL
  Filled 2024-05-16: qty 2

## 2024-05-16 MED ORDER — METHOCARBAMOL 500 MG PO TABS: 500.0000 mg | ORAL_TABLET | Freq: Four times a day (QID) | ORAL | 0 refills | Status: AC
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
## 2024-05-16 NOTE — ED Provider Notes (Signed)
 This patient was signed out to me awaiting results of CT imaging given trauma and significant hypertensive urgency.  Plan was for admission for blood pressure control and symptomatic pain management.  I introduced myself to the patient and discussed the plan, I explained my concern of his significant hypertension and risk of cardiac disease versus renal insufficiency versus acute stroke, he is asymptomatic at this time aside from the back pain which he suspects is causing his elevated blood pressure.  He states that he was given something for the pain and blood pressure seem to have been improving but now pain is coming back.  He suspects that the pain is likely in the setting of a muscle strain.  I offered admission for better blood pressure control and pain management he did receive a dose of morphine  here.  At this time he states that he does not wish to be admitted and would prefer to go home, I again explained risk involved for poorly controlled blood pressure and importance of urgent follow-up.  He verbalizes understanding, we will give him a dose of Robaxin  here, he will return should he change his mind or develop any symptoms concerning for end-stage organ disease otherwise currently this is hypertensive urgency.  Troponin stable here and he is asymptomatic.  Recommended Robaxin  for pain control for home as well as continued ibuprofen and Tylenol .  He verbalizes understanding and is agreeable with the plan.   Fernand Rossie HERO, MD 05/16/24 [phone]
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
## 2024-05-16 NOTE — ED Notes (Signed)
 Patient is being discharged from the Urgent Care and sent to the Emergency Department via PV . Per provider kelly T., patient is in need of higher level of care due to high blood pressure. Patient is aware and verbalizes understanding of plan of care.  Vitals:   05/16/24 0840 05/16/24 0841  BP: (!) 228/160 (!) 226/160  Pulse:    Resp:    Temp:    SpO2:
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
## 2024-05-16 NOTE — ED Provider Notes (Addendum)
 " CAY RALPH PELT    CSN: [id number] Arrival date & time: 05/16/24  0815      History   Chief Complaint Chief Complaint  Patient presents with   Back Pain    Entered by patient    HPI TERION HEDMAN is a 39 y.o. male.  Patient presents with bilateral low back pain that started 2 days ago when his son jumped on his back.  The back pain is worse with movement and ambulation; nonradiating.  It feels like muscle spasms.  He has been treating it with Tylenol .  No numbness, weakness, paresthesias, saddle anesthesia, loss of bowel/bladder control, abdominal pain, dysuria, hematuria, fever, chills.  Patient's medical history includes hypertension.  He denies chest pain or shortness of breath.  He states he is compliant with his blood pressure medications.  The history is provided by the patient and medical records.    Past Medical History:  Diagnosis Date   Hypertension     There are no active problems to display for this patient.   History reviewed. No pertinent surgical history.     Home Medications    Prior to Admission medications  Medication Sig Start Date End Date Taking? Authorizing Provider  hydrochlorothiazide  (HYDRODIURIL ) 12.5 MG tablet Take 2 tablets (25 mg total) by mouth daily. 08/01/23  Yes Dougherty, Lauren A, PA-C  losartan  (COZAAR ) 100 MG tablet Take 1 tablet (100 mg total) by mouth daily. 08/01/23  Yes Dougherty, Lauren A, PA-C  metoprolol  tartrate (LOPRESSOR ) 25 MG tablet Take 1 tablet (25 mg total) by mouth 2 (two) times daily. 08/01/23 07/31/24 Yes Dougherty, Lauren A, PA-C  omeprazole (PRILOSEC) 20 MG capsule Take 20 mg by mouth daily.   Yes [provider]  azithromycin  (ZITHROMAX ) 250 MG tablet Take 1 tablet (250 mg total) by mouth daily. Take first 2 tablets together, then 1 every day until finished. 12/31/23   Teresa Shelba JONELLE, NP  cetirizine (ZYRTEC) 10 MG tablet Take 10 mg by mouth as needed for allergies.    [provider]   doxycycline  (VIBRAMYCIN ) 100 MG capsule Take 1 capsule (100 mg total) by mouth 2 (two) times daily. 08/25/23   White, Adrienne R, NP  montelukast (SINGULAIR) 10 MG tablet Take 10 mg by mouth as needed. Patient not taking: Reported on 05/16/2024    [provider]    Family History Family History  Problem Relation Age of Onset   Thrombocytopenia Mother        ITP   Cancer Paternal Aunt        pancreatic cancer   Cancer Paternal Grandfather        bone/spine    Social History Social History[1]   Allergies   Penicillins   Review of Systems Review of Systems  Constitutional:  Negative for chills and fever.  Respiratory:  Negative for cough and shortness of breath.   Cardiovascular:  Negative for chest pain and palpitations.  Gastrointestinal:  Negative for abdominal pain, nausea and vomiting.  Genitourinary:  Negative for dysuria, flank pain and hematuria.  Musculoskeletal:  Positive for back pain and gait problem.  Skin:  Negative for color change, rash and wound.  Neurological:  Negative for dizziness, syncope, facial asymmetry, speech difficulty, weakness, light-headedness, numbness and headaches.     Physical Exam Triage Vital Signs ED Triage Vitals  Encounter Vitals Group     BP      Girls Systolic BP Percentile      Girls Diastolic BP Percentile  Boys Systolic BP Percentile      Boys Diastolic BP Percentile      Pulse      Resp      Temp      Temp src      SpO2      Weight      Height      Head Circumference      Peak Flow      Pain Score      Pain Loc      Pain Education      Exclude from Growth Chart    No data found.  Updated Vital Signs BP (!) 226/160 (BP Location: Left Wrist)   Pulse (!) 108   Temp 97.9 F (36.6 C) (Oral)   Resp 18   SpO2 98%   Visual Acuity Right Eye Distance:   Left Eye Distance:   Bilateral Distance:    Right Eye Near:   Left Eye Near:    Bilateral Near:     Physical Exam Constitutional:       General: He is not in acute distress. HENT:     Mouth/Throat:     Mouth: Mucous membranes are moist.  Cardiovascular:     Rate and Rhythm: Normal rate and regular rhythm.     Heart sounds: Normal heart sounds.  Pulmonary:     Effort: Pulmonary effort is normal. No respiratory distress.     Breath sounds: Normal breath sounds.  Musculoskeletal:        General: Tenderness present. No swelling or deformity. Normal range of motion.     Comments: Muscular tenderness across lower back.  Skin:    General: Skin is warm and dry.     Findings: No bruising, erythema, lesion or rash.  Neurological:     General: No focal deficit present.     Mental Status: He is alert and oriented to person, place, and time.     Sensory: No sensory deficit.     Motor: No weakness.     Gait: Gait normal.      UC Treatments / Results  Labs (all labs ordered are listed, but only abnormal results are displayed) Labs Reviewed - No data to display  EKG   Radiology No results found.  Procedures Procedures (including critical care time)  Medications Ordered in UC Medications - No data to display  Initial Impression / Assessment and Plan / UC Course  I have reviewed the triage vital signs and the nursing notes.  Pertinent labs & imaging results that were available during my care of the patient were reviewed by me and considered in my medical decision making (see chart for details).    Hypertensive urgency, acute low back pain without sciatica.  Sending patient to the ED as his blood pressure readings are extremely high: 220/156, repeat 228/160, repeated manually 226/160.  Patient has hypertension and states he is compliant with his medications.  He is agreeable to going to the ED and will take himself to [facility] ED now.  He feels stable to drive himself there and declines EMS.  Final Clinical Impressions(s) / UC Diagnoses   Final diagnoses:  Hypertensive urgency  Acute bilateral low back pain  without sciatica     Discharge Instructions      Go to the emergency department for evaluation of your extremely high blood pressure readings:  220/156 Repeat 228/160 Repeat manually 226/160      ED Prescriptions   None    PDMP not  reviewed this encounter.    Corlis Burnard DEL, NP 05/16/24 [phone]     [1]  Social History Tobacco Use   Smoking status: Never   Smokeless tobacco: Never  Vaping Use   Vaping status: Never Used  Substance Use Topics   Alcohol use: Not Currently   Drug use: Never     Corlis Burnard DEL, NP 05/16/24 1141  "

## 2024-05-16 NOTE — Discharge Instructions (Addendum)
 You were seen today due to concern of elevated blood pressure and back pain.  We have offered admission to help with further blood pressure control, given that you are not interested in this at this time, I have written for some muscle relaxer for you to take at home, please take this as instructed.  If you start developing any neurosymptoms such as weakness in your extremities, numbness, change in vision, difficulty with speech, you have any chest pain, or any other symptoms you find concerning please return to the emergency department immediately for further medical management.  It is very important that you follow-up with your primary doctor to discuss further blood pressure management.  I have placed a referral to our cardiology team to help with controlling blood pressure.

## 2024-05-16 NOTE — Discharge Instructions (Addendum)
 Go to the emergency department for evaluation of your extremely high blood pressure readings:  220/156 Repeat 228/160 Repeat manually 226/160

## 2024-05-21 LAB — METANEPHRINES, PLASMA
Metanephrine, Free: 41.3 pg/mL (ref 0.0–88.0)
Normetanephrine, Free: 111.2 pg/mL (ref 0.0–210.1)
